# Patient Record
Sex: Female | Born: 1990 | Race: White | Hispanic: No | Marital: Single
Health system: Southern US, Community
[De-identification: ages and names within clinical notes are randomized; demographics above are authoritative.]

---

## 2013-04-25 ENCOUNTER — Ambulatory Visit: Payer: Self-pay | Admitting: Obstetrics & Gynecology

## 2013-04-25 LAB — CBC WITH DIFFERENTIAL/PLATELET
Basophil #: 0.1 x10 3/mm 3
Basophil %: 0.4 %
Eosinophil #: 0 x10 3/mm 3
Eosinophil %: 0.3 %
HCT: 47.2 % — ABNORMAL HIGH
HGB: 16 g/dL
Lymphocyte %: 19.3 %
Lymphs Abs: 2.3 x10 3/mm 3
MCH: 29.3 pg
MCHC: 34 g/dL
MCV: 86 fL
Monocyte #: 0.6 "x10 3/mm "
Monocyte %: 5 %
Neutrophil #: 9.1 x10 3/mm 3 — ABNORMAL HIGH
Neutrophil %: 75 %
Platelet: 344 x10 3/mm 3
RBC: 5.47 X10 6/mm 3 — ABNORMAL HIGH
RDW: 12.5 %
WBC: 12.1 x10 3/mm 3 — ABNORMAL HIGH

## 2013-04-25 LAB — COMPREHENSIVE METABOLIC PANEL
ALT: 18 U/L (ref 12–78)
Albumin: 4.3 g/dL (ref 3.4–5.0)
Alkaline Phosphatase: 69 U/L
Anion Gap: 7 (ref 7–16)
BUN: 13 mg/dL (ref 7–18)
Bilirubin,Total: 0.6 mg/dL (ref 0.2–1.0)
CALCIUM: 9.7 mg/dL (ref 8.5–10.1)
CHLORIDE: 103 mmol/L (ref 98–107)
CREATININE: 0.99 mg/dL (ref 0.60–1.30)
Co2: 27 mmol/L (ref 21–32)
EGFR (African American): >60
EGFR (Non-African Amer.): >60
GLUCOSE: 107 mg/dL — AB (ref 65–99)
Osmolality: 274 (ref 275–301)
POTASSIUM: 4 mmol/L (ref 3.5–5.1)
SGOT(AST): 13 U/L — ABNORMAL LOW (ref 15–37)
SODIUM: 137 mmol/L (ref 136–145)
Total Protein: 8.8 g/dL — ABNORMAL HIGH (ref 6.4–8.2)

## 2013-04-25 LAB — URINALYSIS, COMPLETE
Bacteria: NONE SEEN
Bilirubin,UR: NEGATIVE
Blood: NEGATIVE
GLUCOSE, UR: NEGATIVE mg/dL (ref 0–75)
Nitrite: NEGATIVE
Ph: 5 (ref 4.5–8.0)
RBC,UR: 2 /HPF (ref 0–5)
SPECIFIC GRAVITY: 1.038 (ref 1.003–1.030)
WBC UR: 5 /HPF (ref 0–5)

## 2013-04-30 LAB — PATHOLOGY REPORT

## 2014-05-31 NOTE — Op Note (Signed)
PATIENT NAME:  Brenda GreaserBALLABEN, Zerina MR#:  [id number] DATE OF BIRTH:  06/25/1990  DATE OF PROCEDURE:  04/25/2013  PREOPERATIVE DIAGNOSIS:  Large left ovarian cyst and pelvic pain.   POSTOPERATIVE DIAGNOSIS:  Large left ovarian cyst and pelvic pain.  PROCEDURE:  Laparoscopy with mini laparotomy and left salpingo-oophorectomy.   SURGEON:  Annamarie MajorPaul Harris, M.D.   ANESTHESIA:  General.   ESTIMATED BLOOD LOSS:  25 mL.   COMPLICATIONS:  None.   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  Fallopian tube is attenuated and wrapped around the ovary.  Right tube and ovary is normal as is uterus and other pelvic anatomy.   DISPOSITION:  To the recovery room in stable condition.   TECHNIQUE:  The patient is prepped and draped in the usual sterile fashion after adequate anesthesia is obtained in the dorsal lithotomy position.  Bladder has a Foley catheter placed and a tenaculum is placed on the cervix for manipulation purposes.   Attention is then turned to the abdomen where a Veress needle is inserted through a 5 mm infraumbilical incision after Marcaine is used to anesthetize the skin.  Veress needle placement is confirmed using the hanging drop technique and the abdomen is then insufflated with CO2 gas.  A 5 mm trocar is then inserted under direct visualization with the laparoscope with no injuries or bleeding noted.  The patient is placed in Trendelenburg positioning and the above-mentioned findings are visualized.  A 5 mm trocar is placed in the left lower quadrant and an 11 mm trocar is placed in the suprapubic region with careful placement not to puncture the cyst.  The cyst is then carefully punctured and aspirated.  It is multifocal and multicystic and so multiple punctures are made to try and drain out the mucinous type fluid.  The cyst wall is grasped and attempted to dissect free the cyst wall from the ovarian cortex.  It does not  dissect well at all and in fact it is completely intertwined and involved with ovarian cortex.  The fallopian tube is also wrapped around the ovary and stretched and attenuated to a severe degree.  Due to these reasons the decision is made for complete excision of left tube and ovary as any decision to leave part of the ovary or tube behind in the clinical judgment of the operator could lead to recurrence or adhesions or continued inflammation and complications.  Using the 5 mm Harmonic scalpel the infundibulopelvic blood vessels and ligaments are carefully coagulated and cut as are the utero-ovarian blood vessels and ligaments and the ovary and tube is amputated away from the uterus and its connections.  The pelvic area is irrigated with copious amounts of saline with aspiration of fluid and observation of excellent hemostasis.  The midline trocar is then removed and the ovarian cyst is grasped with Allis clamp and pulled up to the incision.  The incision is then extended in each direction large enough to remove the ovary.  Reinspection reveals excellent hemostasis at all sites.  No injuries to bowel, bladder, ureter or other structures.  Gas is expelled.  The rectus fascia is closed with 0 Vicryl suture.  The subcutaneous layer is closed as is subcuticular layer along with Dermabond for skin closure at all sites.  Tenaculum is removed.  Foley catheter is left in place.  The patient goes to the recovery room in stable condition.  All sponge, instrument, and needle counts are  correct.    ____________________________ R. Annamarie Major, MD rph:ea D: 04/25/2013 20:00:20 ET T: 04/26/2013 03:44:36 ET JOB#: [id number]  cc: Dierdre Searles, MD, <Dictator> Nadara Mustard MD ELECTRONICALLY SIGNED 05/01/2013 9:09

## 2014-09-25 IMAGING — CT CT ABD-PELV W/ CM
2 of 5 series · 16 of 46 positions shown, 18 images · IV contrast (isovue)
Comparison: None.

CLINICAL DATA: CVA pelvic pain for 4 days

EXAM:
CT ABDOMEN AND PELVIS WITH CONTRAST
TECHNIQUE: Multidetector CT imaging of the abdomen and pelvis was performed
using the standard protocol following bolus administration of
intravenous contrast.
CONTRAST:  85 cc Isovue

[Series 2: routine abd pel with · axial · 0.65mm/px · z∈[-418,-22]mm · 13 of 89 slices shown, 15 images]
[im 5/89  soft-tissue]
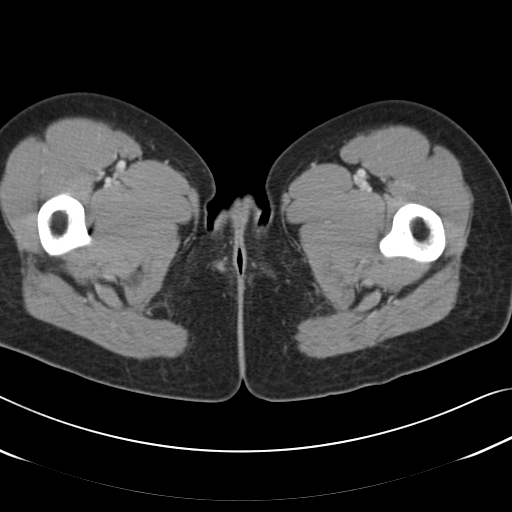
[im 5/89  bone]
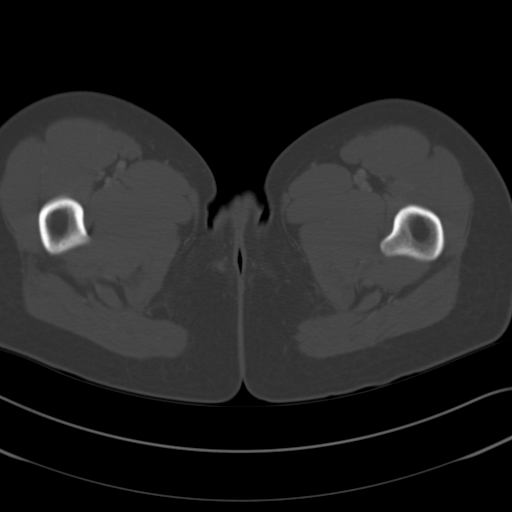
[im 10/89  soft-tissue]
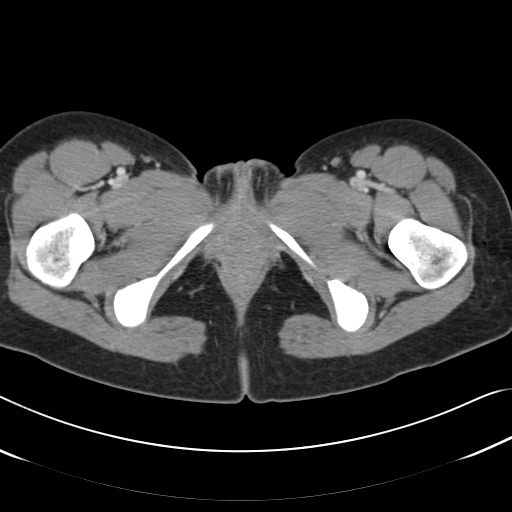
[im 20/89  soft-tissue]
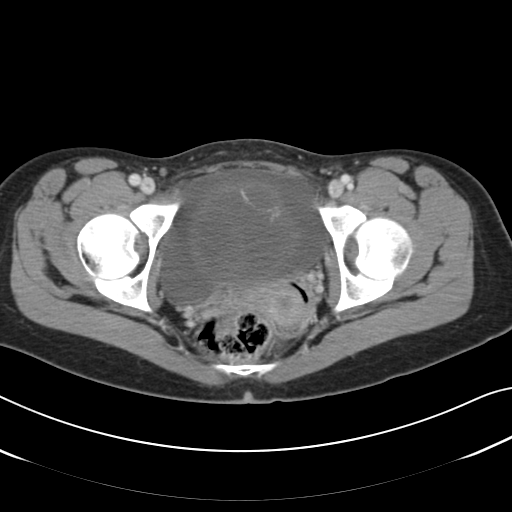
[im 25/89  soft-tissue]
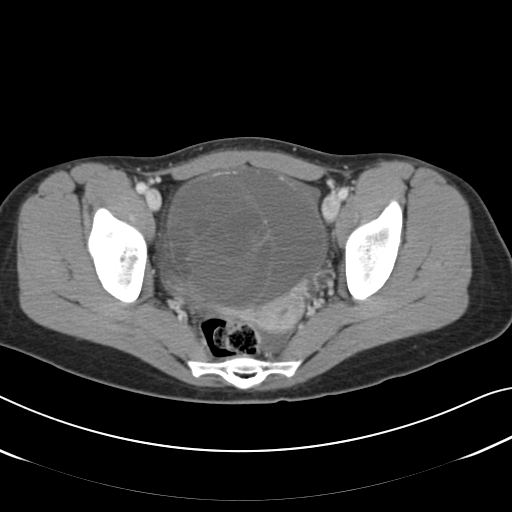
[im 30/89  soft-tissue]
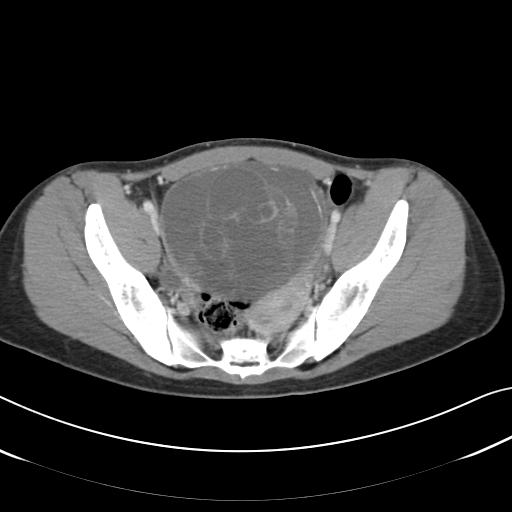
[im 40/89  soft-tissue]
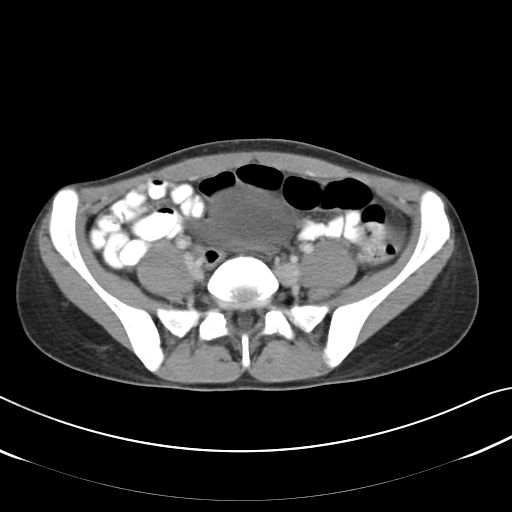
[im 45/89  soft-tissue]
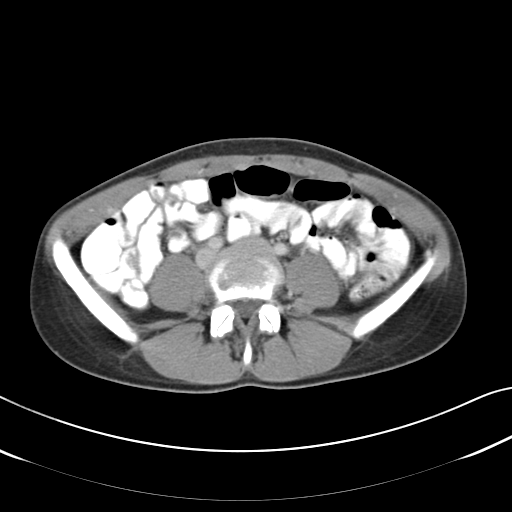
[im 49/89  soft-tissue]
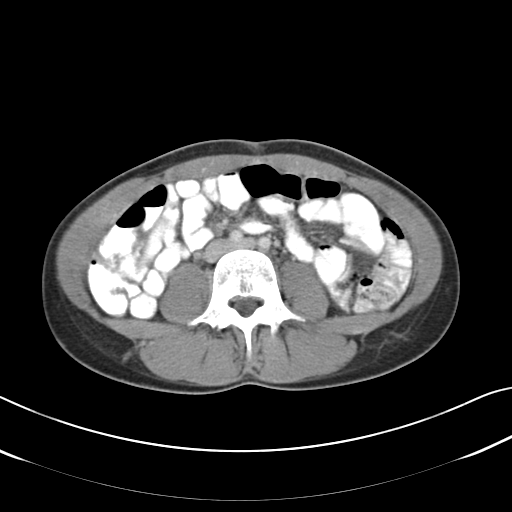
[im 59/89  soft-tissue]
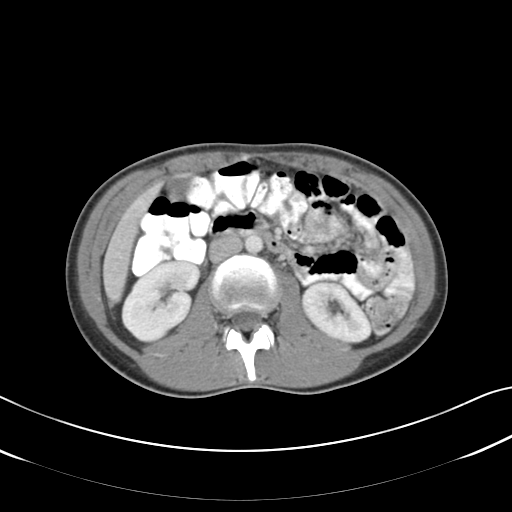
[im 59/89  bone]
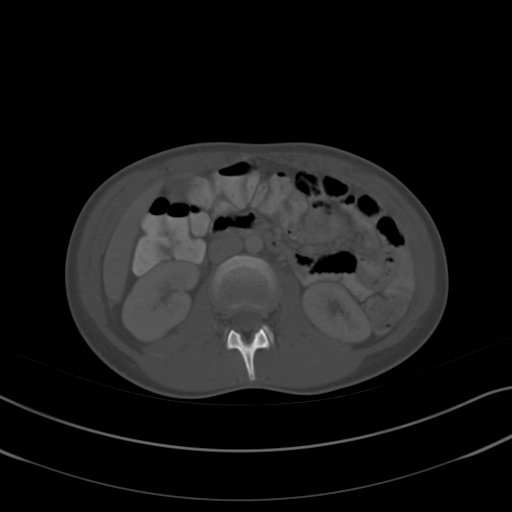
[im 64/89  soft-tissue]
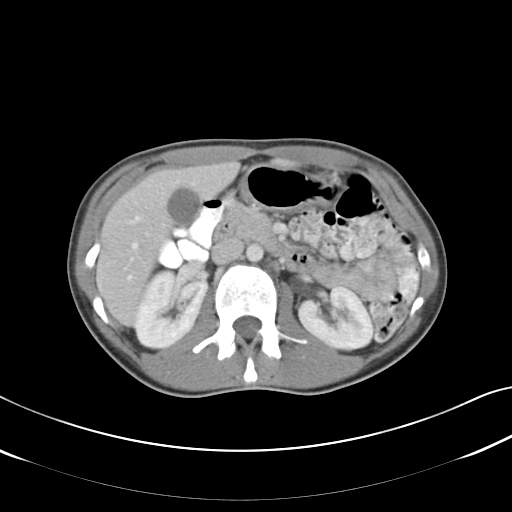
[im 69/89  soft-tissue]
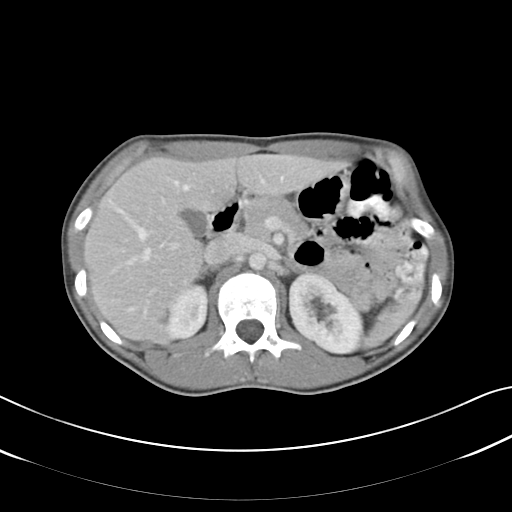
[im 79/89  soft-tissue]
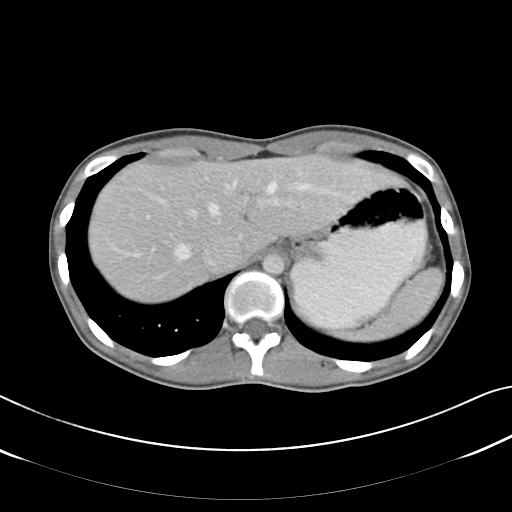
[im 84/89  soft-tissue]
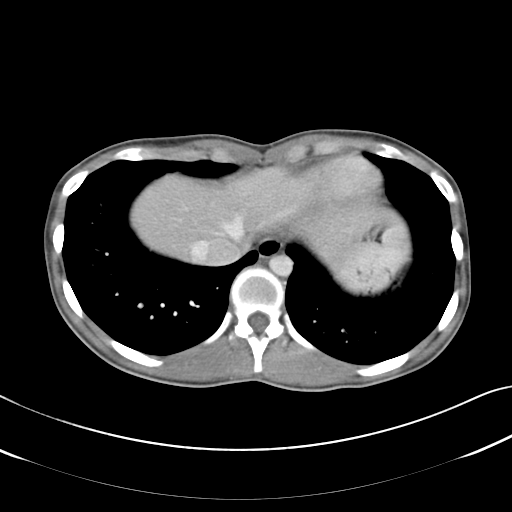

[Series 5: cor routine abd pel with · coronal · 0.70mm/px · 3 of 105 slices shown]
[im 35/105  soft-tissue]
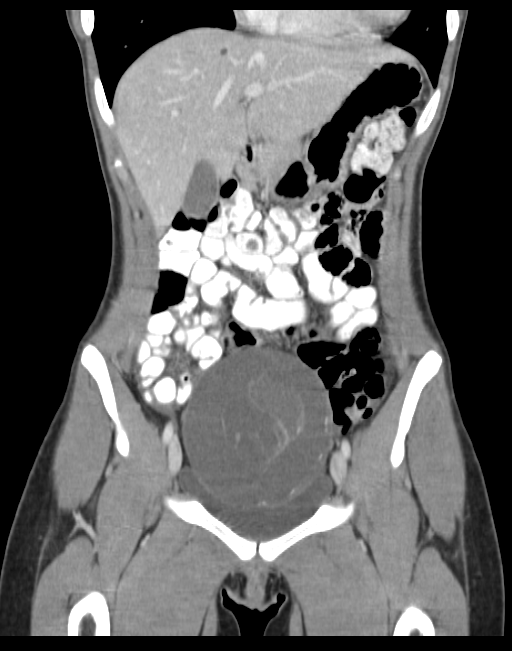
[im 47/105  soft-tissue]
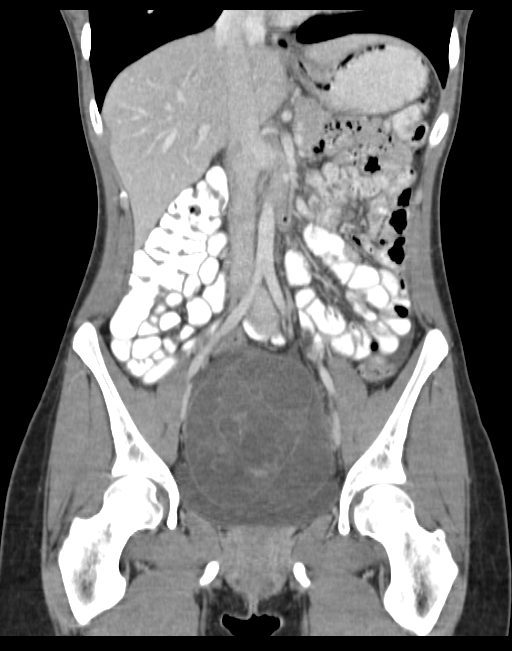
[im 58/105  soft-tissue]
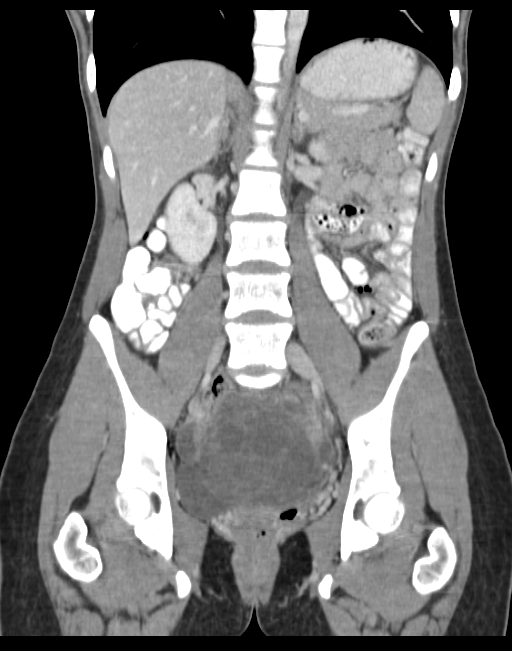

[16 of 46 positions shown; findings below may reference images not displayed]

FINDINGS: Sagittal images of the spine are unremarkable. Lung bases are
unremarkable enhanced liver shows no biliary ductal dilatation.
Probable tiny cyst in right hepatic lobe anteriorly measures 6 mm.
The pancreas, spleen and adrenal glands are unremarkable. Kidneys
are symmetrical in size and enhancement. No hydronephrosis or
hydroureter.

There is no small bowel obstruction. No ascites or free air. No
adenopathy.

There is no pericecal inflammation.

There is a large cystic multiseptated mass in the pelvis just above
the urinary bladder measures at least 9.6 x 10.7 cm. On sagittal
image 93 the lesion is extending at least 12 cm cranial caudally.
This is highly suspicious for ovarian cystic neoplasm. Correlation
with GYN exam and surgical consultation recommended. Small amount of
pelvic free fluid. There is mass effect on the urinary bladder and
uterus. On delay pelvic images there is minimal dilatation bilateral
distal ureter however contrast/urine is noted filling the bladder.
IMPRESSION: 1. There is a large cystic ovoid multiseptated mass in the pelvis
just above the urinary bladder with mass effect on the urinary
bladder and uterus. The uterus is retroflexed position. The lesion
measures at least 10.7 x 9.6 x 12 cm. Highly suspicious for ovarian
cystic neoplasm. Correlation with GYN exam and surgical consult is
recommended.
2. No small bowel or colonic obstruction.
3. Small amount of pelvic free fluid.
These results were called by telephone at the time of interpretation
on 04/25/2013 at [DATE] to Dr. Jumper, who verbally acknowledged
these results.

## 2014-10-07 ENCOUNTER — Telehealth: Payer: Self-pay | Admitting: Family Medicine

## 2014-10-07 NOTE — Telephone Encounter (Signed)
Pt called wanting a copy of her immunization records. Its ok to leave message CB# [phone]. CC

## 2014-10-07 NOTE — Telephone Encounter (Signed)
LM for pt to pick up immunization record at front desk. sd

## 2023-09-05 ENCOUNTER — Other Ambulatory Visit: Payer: Self-pay | Admitting: Medical Genetics

## 2023-11-17 ENCOUNTER — Encounter: Payer: Self-pay | Admitting: *Deleted

## 2023-11-17 ENCOUNTER — Other Ambulatory Visit: Payer: Self-pay | Admitting: Medical Genetics

## 2023-11-17 DIAGNOSIS — Z006 Encounter for examination for normal comparison and control in clinical research program: Secondary | ICD-10-CM
